# Patient Record
Sex: Male | Born: 1960 | Race: White | Hispanic: No | Marital: Married | Smoking: Never smoker
Health system: Southern US, Community
[De-identification: ages and names within clinical notes are randomized; demographics above are authoritative.]

## PROBLEM LIST (undated history)

## (undated) DIAGNOSIS — E669 Obesity, unspecified: Secondary | ICD-10-CM

## (undated) DIAGNOSIS — K831 Obstruction of bile duct: Secondary | ICD-10-CM

## (undated) DIAGNOSIS — L03116 Cellulitis of left lower limb: Secondary | ICD-10-CM

## (undated) DIAGNOSIS — E119 Type 2 diabetes mellitus without complications: Secondary | ICD-10-CM

## (undated) DIAGNOSIS — A414 Sepsis due to anaerobes: Secondary | ICD-10-CM

## (undated) DIAGNOSIS — T8649 Other complications of liver transplant: Secondary | ICD-10-CM

## (undated) HISTORY — PX: EXPLORATORY LAPAROTOMY: SUR591

## (undated) HISTORY — PX: TONSILLECTOMY: SUR1361

## (undated) HISTORY — PX: ERCP W/ METAL STENT PLACEMENT: SHX1521

## (undated) HISTORY — PX: HERNIA REPAIR: SHX51

---

## 2008-06-15 ENCOUNTER — Ambulatory Visit: Payer: Self-pay | Admitting: Internal Medicine

## 2009-04-11 ENCOUNTER — Ambulatory Visit: Payer: Self-pay | Admitting: Gastroenterology

## 2009-05-27 ENCOUNTER — Ambulatory Visit: Payer: Self-pay | Admitting: Internal Medicine

## 2009-06-27 ENCOUNTER — Ambulatory Visit: Payer: Self-pay | Admitting: Internal Medicine

## 2009-07-02 ENCOUNTER — Ambulatory Visit: Payer: Self-pay | Admitting: Gastroenterology

## 2009-07-06 ENCOUNTER — Ambulatory Visit: Payer: Self-pay | Admitting: Internal Medicine

## 2009-07-13 ENCOUNTER — Inpatient Hospital Stay: Payer: Self-pay | Admitting: Internal Medicine

## 2009-07-27 ENCOUNTER — Ambulatory Visit: Payer: Self-pay | Admitting: Internal Medicine

## 2009-08-09 ENCOUNTER — Inpatient Hospital Stay: Payer: Self-pay | Admitting: Internal Medicine

## 2009-08-20 ENCOUNTER — Ambulatory Visit: Payer: Self-pay | Admitting: Internal Medicine

## 2009-08-21 ENCOUNTER — Ambulatory Visit: Payer: Self-pay | Admitting: Gastroenterology

## 2009-08-23 ENCOUNTER — Emergency Department: Payer: Self-pay | Admitting: Emergency Medicine

## 2009-08-27 ENCOUNTER — Ambulatory Visit: Payer: Self-pay | Admitting: Internal Medicine

## 2009-10-04 ENCOUNTER — Ambulatory Visit: Payer: Self-pay | Admitting: Gastroenterology

## 2009-12-13 ENCOUNTER — Ambulatory Visit: Payer: Self-pay | Admitting: Gastroenterology

## 2009-12-31 DIAGNOSIS — Z944 Liver transplant status: Secondary | ICD-10-CM | POA: Insufficient documentation

## 2010-07-15 DIAGNOSIS — B259 Cytomegaloviral disease, unspecified: Secondary | ICD-10-CM | POA: Insufficient documentation

## 2010-10-24 DIAGNOSIS — K831 Obstruction of bile duct: Secondary | ICD-10-CM | POA: Insufficient documentation

## 2010-11-18 DIAGNOSIS — D72819 Decreased white blood cell count, unspecified: Secondary | ICD-10-CM | POA: Insufficient documentation

## 2010-11-18 DIAGNOSIS — E119 Type 2 diabetes mellitus without complications: Secondary | ICD-10-CM | POA: Insufficient documentation

## 2011-03-12 HISTORY — PX: ROUX-EN-Y PROCEDURE: SUR1287

## 2012-01-05 DIAGNOSIS — D849 Immunodeficiency, unspecified: Secondary | ICD-10-CM | POA: Insufficient documentation

## 2012-03-13 IMAGING — XA DG CHEST 1V
1 series · 1 of 1 positions shown · non-contrast
Comparison: none

REASON FOR EXAM: PICC placement
COMMENTS:

PROCEDURE:     VAS - CHEST FRONTAL SINGLE VIEW  - July 17, 2009  [DATE]
RESULT:     Frontal view of the right hemithorax status post PICC line
placement is performed.
A right sided PICC line is appreciated with the tip projecting in the region
of the superior vena cava/right atrial junction.

[Series 1: single · 1 of 1 slices shown]
[im 1/1]
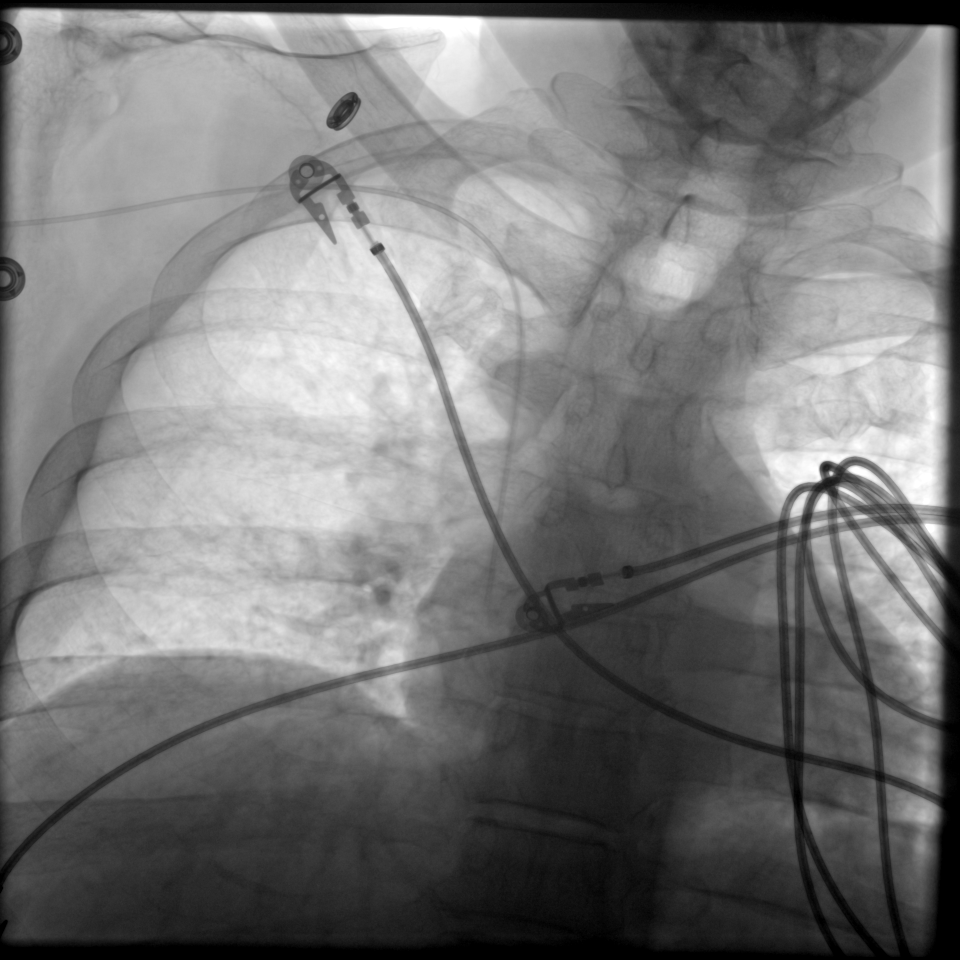

[1 of 1 positions shown; findings below may reference images not displayed]

IMPRESSION: Right sided PICC line placement as described above.

## 2012-04-19 IMAGING — CT CT ABD-PELV W/ CM
1 of 3 series · 14 of 32 positions shown, 19 images · IV contrast (isovue)
Comparison: 04/11/2009

REASON FOR EXAM: (1) RUQ/RLQ/right flank pain sp liver biopsy &
paracentesis; (2) RUQ/RLQ/right f
COMMENTS:

PROCEDURE:     CT  - CT ABDOMEN / PELVIS  W  - August 23, 2009  [DATE]
RESULT:     History: Right lower quadrant pain
TECHNIQUE: Multiple axial images of the abdomen and pelvis were performed
from the lung bases to the pubic symphysis, with p.o. contrast and with 100
ml of Isovue 370 intravenous contrast.

[Series 2: 3mm soft tissue · axial · 0.83mm/px · z∈[-658,-208]mm · 14 of 168 slices shown, 19 images]
[im 9/168  soft-tissue]
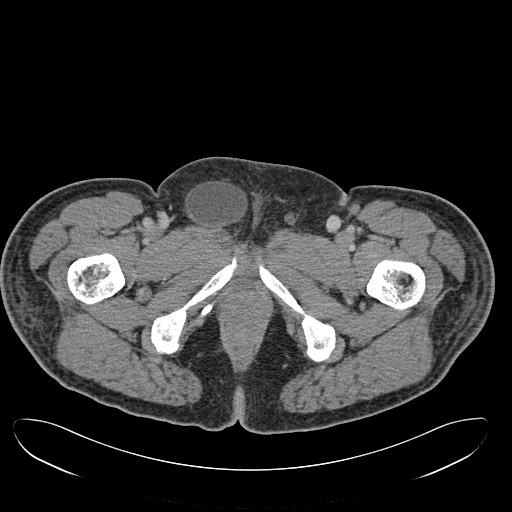
[im 9/168  bone]
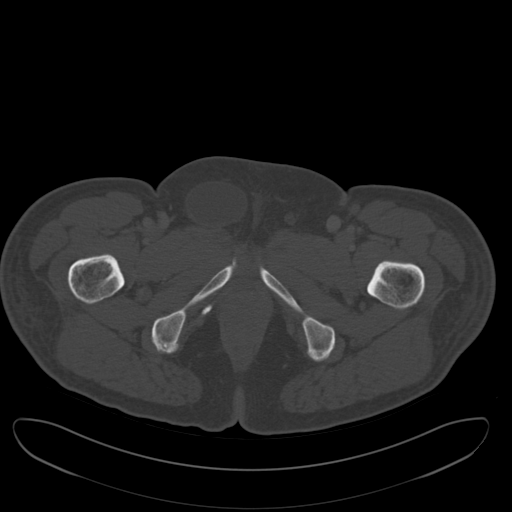
[im 26/168  soft-tissue]
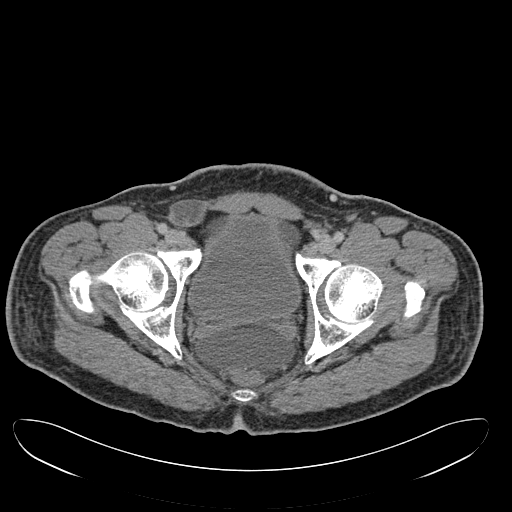
[im 34/168  soft-tissue]
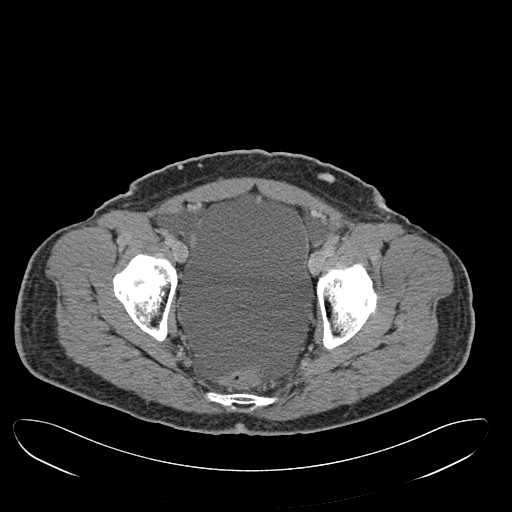
[im 51/168  soft-tissue]
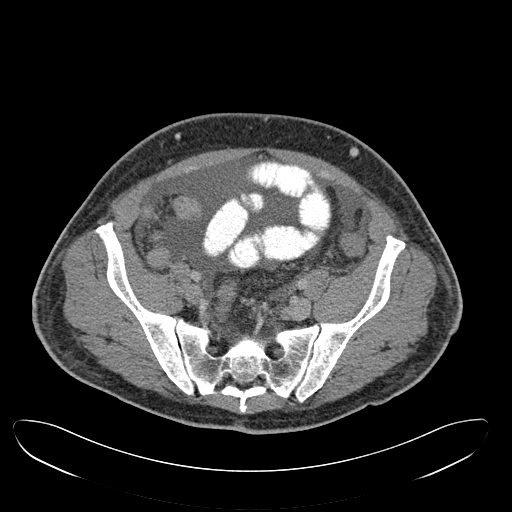
[im 59/168  soft-tissue]
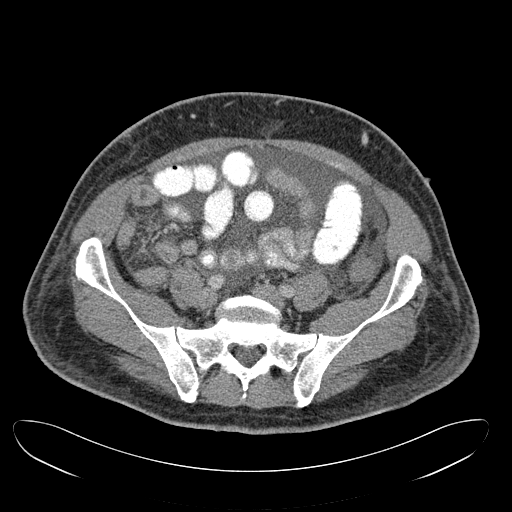
[im 76/168  soft-tissue]
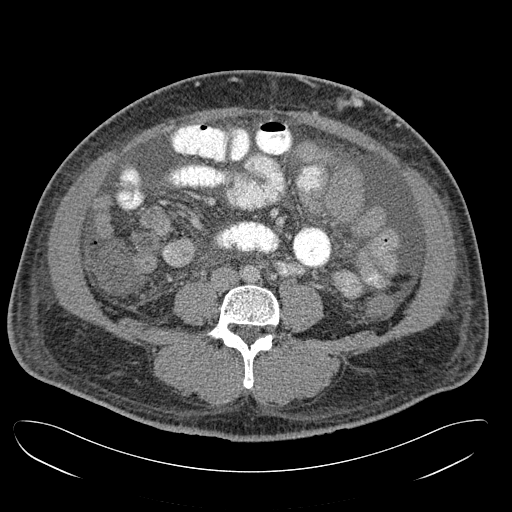
[im 84/168  soft-tissue]
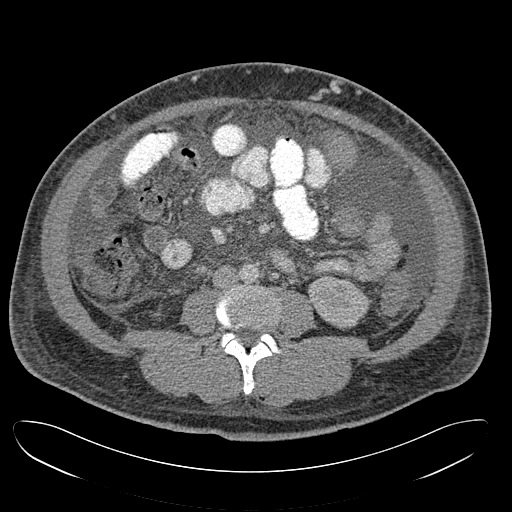
[im 92/168  soft-tissue]
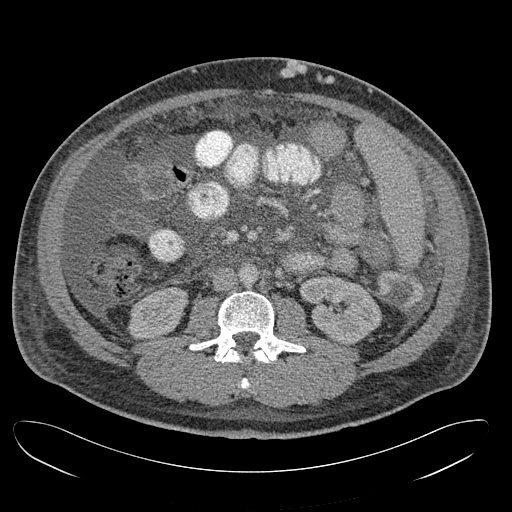
[im 109/168  soft-tissue]
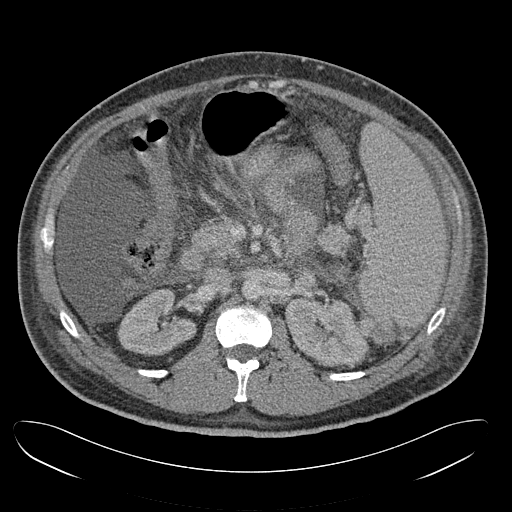
[im 109/168  bone]
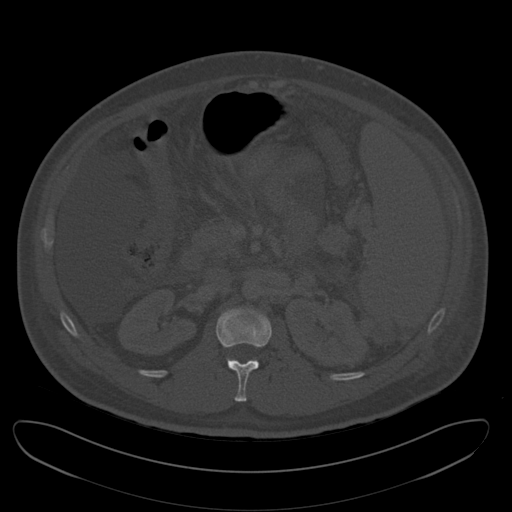
[im 117/168  soft-tissue]
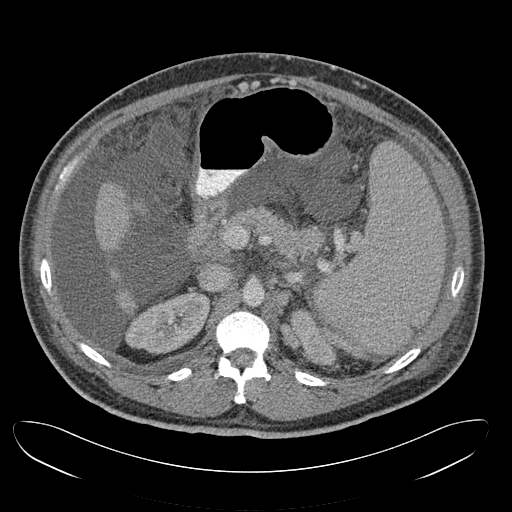
[im 134/168  soft-tissue]
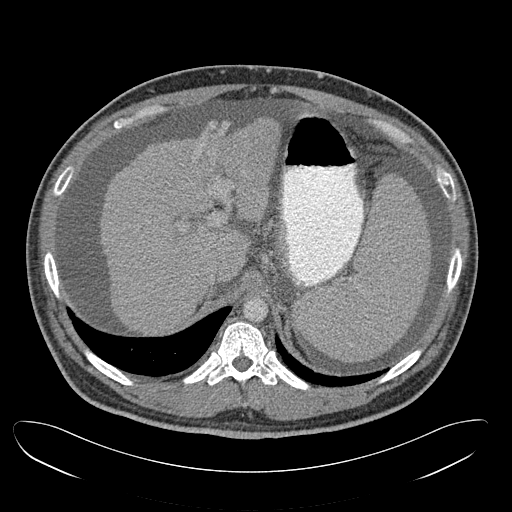
[im 134/168  lung]
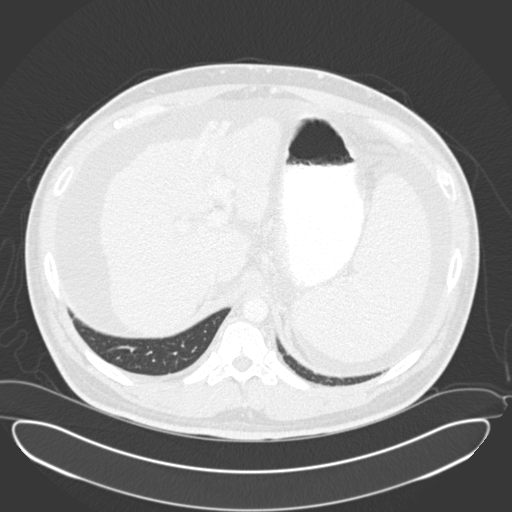
[im 142/168  soft-tissue]
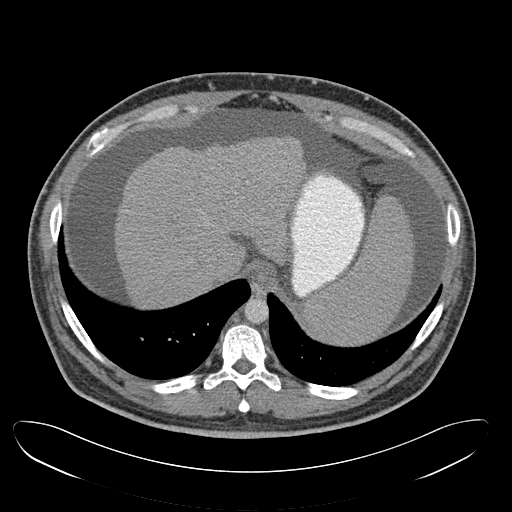
[im 142/168  lung]
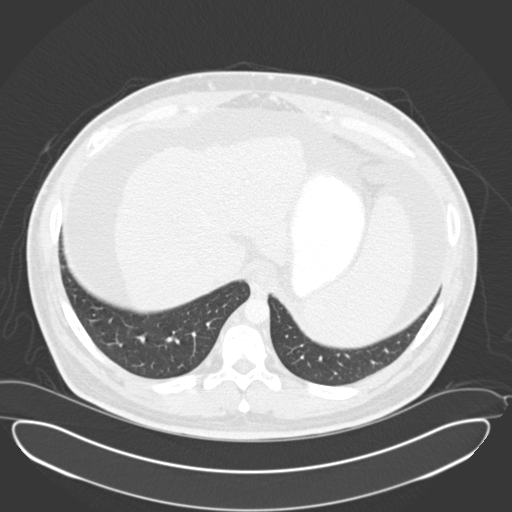
[im 151/168  lung]
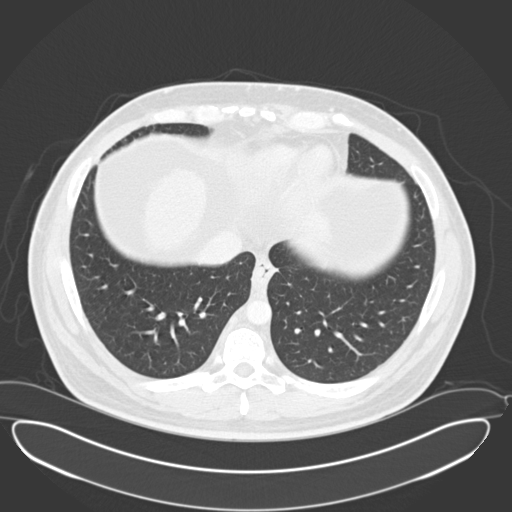
[im 159/168  soft-tissue]
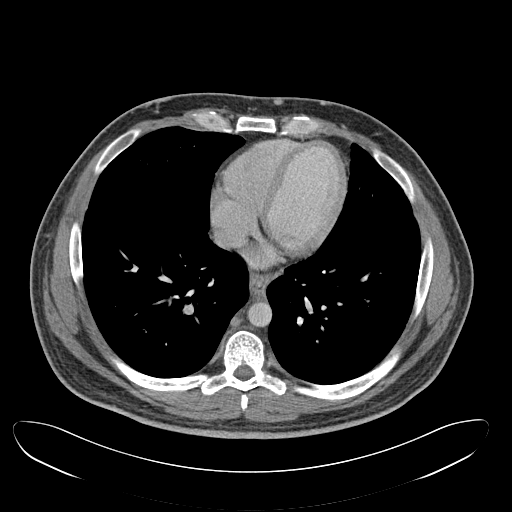
[im 159/168  lung]
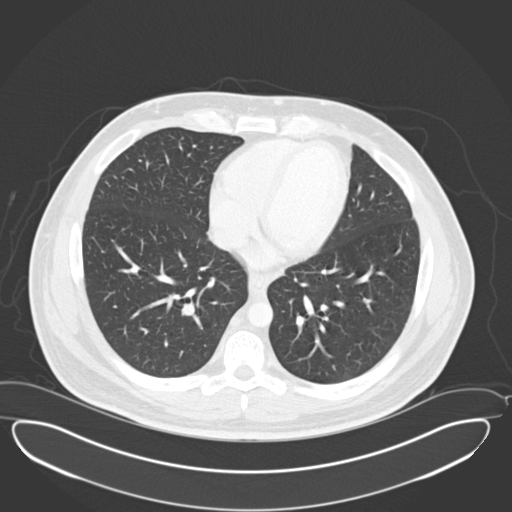

[14 of 32 positions shown; findings below may reference images not displayed]

FINDINGS: The lung bases are clear. There is no pneumothorax. The heart size is
normal.

The liver is diminutive in size with a micronodular contour consistent with
cirrhosis. There are gastric varices.  The spleen is enlarged measuring
cm in greatest length. There is no intrahepatic or extrahepatic biliary
ductal dilatation. The gallbladder is unremarkable. The spleen demonstrates
no focal abnormality. The kidneys, adrenal glands, and pancreas are normal.
The bladder is unremarkable.

There is anasarca. There is a large amount of ascites. The stomach,
duodenum, small intestine, and large intestine demonstrate no contrast
extravasation or dilatation. There is a normal caliber appendix in the right
lower quadrant without periappendiceal inflammatory changes. There is a
right inguinal hernia containing ascites. There is no pneumoperitoneum,
pneumatosis, or portal venous gas. There is no lymphadenopathy.

The abdominal aorta is normal in caliber.

The osseous structures are unremarkable.
IMPRESSION: 1. Cirrhotic liver with extensive varices consistent with portal
hypertension and a large amount of ascites.
2. Normal appendix.

## 2013-07-11 DIAGNOSIS — K7581 Nonalcoholic steatohepatitis (NASH): Secondary | ICD-10-CM | POA: Insufficient documentation

## 2016-10-08 ENCOUNTER — Encounter: Payer: Self-pay | Admitting: *Deleted

## 2016-10-09 ENCOUNTER — Ambulatory Visit: Payer: 59 | Admitting: Anesthesiology

## 2016-10-09 ENCOUNTER — Ambulatory Visit
Admission: RE | Admit: 2016-10-09 | Discharge: 2016-10-09 | Disposition: A | Discharge status: Home / Self Care | Payer: 59 | Source: Ambulatory Visit | Attending: Gastroenterology | Admitting: Gastroenterology

## 2016-10-09 ENCOUNTER — Encounter
Admission: RE | Disposition: A | Discharge status: Home / Self Care | Payer: Self-pay | Source: Ambulatory Visit | Attending: Gastroenterology

## 2016-10-09 DIAGNOSIS — Z944 Liver transplant status: Secondary | ICD-10-CM | POA: Diagnosis not present

## 2016-10-09 DIAGNOSIS — E8801 Alpha-1-antitrypsin deficiency: Secondary | ICD-10-CM | POA: Insufficient documentation

## 2016-10-09 DIAGNOSIS — K5730 Diverticulosis of large intestine without perforation or abscess without bleeding: Secondary | ICD-10-CM | POA: Diagnosis not present

## 2016-10-09 DIAGNOSIS — Z79899 Other long term (current) drug therapy: Secondary | ICD-10-CM | POA: Insufficient documentation

## 2016-10-09 DIAGNOSIS — Z91040 Latex allergy status: Secondary | ICD-10-CM | POA: Insufficient documentation

## 2016-10-09 DIAGNOSIS — Z7984 Long term (current) use of oral hypoglycemic drugs: Secondary | ICD-10-CM | POA: Diagnosis not present

## 2016-10-09 DIAGNOSIS — Z1211 Encounter for screening for malignant neoplasm of colon: Secondary | ICD-10-CM | POA: Diagnosis not present

## 2016-10-09 DIAGNOSIS — K7469 Other cirrhosis of liver: Secondary | ICD-10-CM | POA: Diagnosis not present

## 2016-10-09 DIAGNOSIS — D124 Benign neoplasm of descending colon: Secondary | ICD-10-CM | POA: Diagnosis not present

## 2016-10-09 DIAGNOSIS — E119 Type 2 diabetes mellitus without complications: Secondary | ICD-10-CM | POA: Diagnosis not present

## 2016-10-09 HISTORY — DX: Obstruction of bile duct: K83.1

## 2016-10-09 HISTORY — DX: Sepsis due to anaerobes: A41.4

## 2016-10-09 HISTORY — DX: Cellulitis of left lower limb: L03.116

## 2016-10-09 HISTORY — DX: Obesity, unspecified: E66.9

## 2016-10-09 HISTORY — DX: Type 2 diabetes mellitus without complications: E11.9

## 2016-10-09 HISTORY — DX: Other complications of liver transplant: T86.49

## 2016-10-09 HISTORY — PX: COLONOSCOPY WITH PROPOFOL: SHX5780

## 2016-10-09 LAB — CBC WITH DIFFERENTIAL/PLATELET
BASOS ABS: 0 10*3/uL (ref 0–0.1)
Basophils Relative: 0 %
Eosinophils Absolute: 0.2 10*3/uL (ref 0–0.7)
Eosinophils Relative: 4 %
HCT: 46.9 % (ref 40.0–52.0)
HEMOGLOBIN: 17 g/dL (ref 13.0–18.0)
LYMPHS ABS: 1.2 10*3/uL (ref 1.0–3.6)
Lymphocytes Relative: 23 %
MCH: 33.2 pg (ref 26.0–34.0)
MCHC: 36.2 g/dL — ABNORMAL HIGH (ref 32.0–36.0)
MCV: 91.6 fL (ref 80.0–100.0)
Monocytes Absolute: 0.4 10*3/uL (ref 0.2–1.0)
Monocytes Relative: 8 %
NEUTROS PCT: 65 %
Neutro Abs: 3.4 10*3/uL (ref 1.4–6.5)
PLATELETS: 128 10*3/uL — AB (ref 150–440)
RBC: 5.12 MIL/uL (ref 4.40–5.90)
RDW: 14.3 % (ref 11.5–14.5)
WBC: 5.2 10*3/uL (ref 3.8–10.6)

## 2016-10-09 LAB — PROTIME-INR
INR: 0.98
Prothrombin Time: 12.9 seconds (ref 11.4–15.2)

## 2016-10-09 LAB — GLUCOSE, CAPILLARY: GLUCOSE-CAPILLARY: 198 mg/dL — AB (ref 65–99)

## 2016-10-09 SURGERY — COLONOSCOPY WITH PROPOFOL
Anesthesia: General

## 2016-10-09 MED ORDER — PROPOFOL 10 MG/ML IV BOLUS
INTRAVENOUS | Status: DC | PRN
  Administered 2016-10-09: 100 mg via INTRAVENOUS

## 2016-10-09 MED ORDER — ONDANSETRON HCL 4 MG/2ML IJ SOLN: 4.0000 mg | Freq: Once | INTRAMUSCULAR | Status: DC | PRN

## 2016-10-09 MED ORDER — FENTANYL CITRATE (PF) 100 MCG/2ML IJ SOLN: 25.0000 ug | INTRAMUSCULAR | Status: DC | PRN

## 2016-10-09 MED ORDER — SODIUM CHLORIDE 0.9 % IV SOLN
INTRAVENOUS | Status: DC | PRN
  Administered 2016-10-09: 08:00:00 via INTRAVENOUS

## 2016-10-09 MED ORDER — PROPOFOL 500 MG/50ML IV EMUL
INTRAVENOUS | Status: AC
  Filled 2016-10-09: qty 50

## 2016-10-09 MED ORDER — LIDOCAINE 2% (20 MG/ML) 5 ML SYRINGE
INTRAMUSCULAR | Status: DC | PRN
  Administered 2016-10-09: 40 mg via INTRAVENOUS

## 2016-10-09 MED ORDER — LIDOCAINE HCL (PF) 2 % IJ SOLN
INTRAMUSCULAR | Status: AC
  Filled 2016-10-09: qty 2

## 2016-10-09 MED ORDER — FENTANYL CITRATE (PF) 100 MCG/2ML IJ SOLN
INTRAMUSCULAR | Status: DC | PRN
  Administered 2016-10-09: 50 ug via INTRAVENOUS

## 2016-10-09 MED ORDER — MIDAZOLAM HCL 2 MG/2ML IJ SOLN
INTRAMUSCULAR | Status: AC
  Filled 2016-10-09: qty 2

## 2016-10-09 MED ORDER — FENTANYL CITRATE (PF) 100 MCG/2ML IJ SOLN
INTRAMUSCULAR | Status: AC
Start: 2016-10-09 — End: 2016-10-09
  Filled 2016-10-09: qty 2

## 2016-10-09 MED ORDER — SODIUM CHLORIDE 0.9 % IV SOLN: INTRAVENOUS | Status: DC

## 2016-10-09 MED ORDER — PROPOFOL 10 MG/ML IV BOLUS
INTRAVENOUS | Status: AC
  Filled 2016-10-09: qty 20

## 2016-10-09 MED ORDER — PROPOFOL 500 MG/50ML IV EMUL
INTRAVENOUS | Status: DC | PRN
  Administered 2016-10-09: 160 ug/kg/min via INTRAVENOUS

## 2016-10-09 MED ORDER — MIDAZOLAM HCL 5 MG/5ML IJ SOLN
INTRAMUSCULAR | Status: DC | PRN
  Administered 2016-10-09: 1 mg via INTRAVENOUS

## 2016-10-09 MED ORDER — SODIUM CHLORIDE 0.9 % IV SOLN
INTRAVENOUS | Status: DC
  Administered 2016-10-09: 09:00:00 via INTRAVENOUS

## 2016-10-09 NOTE — Anesthesia Postprocedure Evaluation (Signed)
Anesthesia Post Note  Patient: Matthew Benson  Procedure(s) Performed: Procedure(s) (LRB): COLONOSCOPY WITH PROPOFOL (N/A)  Patient location during evaluation: PACU Anesthesia Type: General Level of consciousness: awake and alert and oriented Pain management: pain level controlled Vital Signs Assessment: post-procedure vital signs reviewed and stable Respiratory status: spontaneous breathing Cardiovascular status: blood pressure returned to baseline Anesthetic complications: no     Last Vitals:  Vitals:   10/09/16 0924 10/09/16 0953  BP:  (!) 124/91  Pulse:  88  Resp:  16  Temp: (!) 36.2 C   SpO2:  97%    Last Pain:  Vitals:   10/09/16 0818  TempSrc: Tympanic                 Evelio Rueda

## 2016-10-09 NOTE — Transfer of Care (Signed)
Immediate Anesthesia Transfer of Care Note  Patient: Matthew Benson  Procedure(s) Performed: Procedure(s): COLONOSCOPY WITH PROPOFOL (N/A)  Patient Location: PACU and Endoscopy Unit  Anesthesia Type:General  Level of Consciousness: drowsy  Airway & Oxygen Therapy: Patient Spontanous Breathing and Patient connected to nasal cannula oxygen  Post-op Assessment: Report given to RN and Post -op Vital signs reviewed and stable  Post vital signs: Reviewed and stable  Last Vitals:  Vitals:   10/09/16 0818  BP: 130/90  Pulse: 94  Resp: 16  Temp: (!) 36.3 C  SpO2: 99%    Last Pain:  Vitals:   10/09/16 0818  TempSrc: Tympanic         Complications: No apparent anesthesia complications

## 2016-10-09 NOTE — Anesthesia Preprocedure Evaluation (Addendum)
Anesthesia Evaluation  Patient identified by MRN, date of birth, ID band Patient awake    Reviewed: Allergy & Precautions, NPO status , Patient's Chart, lab work & pertinent test results  Airway Mallampati: II  TM Distance: >3 FB     Dental  (+) Chipped   Pulmonary pneumonia, resolved,    Pulmonary exam normal        Cardiovascular negative cardio ROS Normal cardiovascular exam     Neuro/Psych    GI/Hepatic   Endo/Other  diabetes  Renal/GU negative Renal ROS  negative genitourinary   Musculoskeletal negative musculoskeletal ROS (+)   Abdominal Normal abdominal exam  (+)   Peds negative pediatric ROS (+)  Hematology negative hematology ROS (+)   Anesthesia Other Findings   Reproductive/Obstetrics                            Anesthesia Physical Anesthesia Plan  ASA: III  Anesthesia Plan: General   Post-op Pain Management:    Induction: Intravenous  PONV Risk Score and Plan:   Airway Management Planned: Nasal Cannula  Additional Equipment:   Intra-op Plan:   Post-operative Plan:   Informed Consent: I have reviewed the patients History and Physical, chart, labs and discussed the procedure including the risks, benefits and alternatives for the proposed anesthesia with the patient or authorized representative who has indicated his/her understanding and acceptance.   Dental advisory given  Plan Discussed with: CRNA and Surgeon  Anesthesia Plan Comments:         Anesthesia Quick Evaluation

## 2016-10-09 NOTE — H&P (Signed)
Outpatient short stay form Pre-procedure 10/09/2016 8:42 AM Lollie Sails MD  Primary Physician: Dr. Enis Gash  Reason for visit:  Screening colonoscopy  History of present illness:  Patient is being seen in referral from [facility] hepatology in regards to colon cancer screening. He tolerated his prep well. He takes no aspirin or blood thinning agents with the exception of 81 mg daily aspirin that was held with yesterday.  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 He has done well since his transplant. CBC and pro time were checked today and were good for procedure.    Current Facility-Administered Medications:  .  0.9 %  sodium chloride infusion, , Intravenous, Continuous, Lollie Sails, MD, Last Rate: 20 mL/hr at 10/09/16 [phone] .  0.9 %  sodium chloride infusion, , Intravenous, Continuous, Lollie Sails, MD  Facility-Administered Medications Ordered in Other Encounters:  .  0.9 %  sodium chloride infusion, , , Continuous PRN, Courtney Paris, CRNA  Prescriptions Prior to Admission  Medication Sig Dispense Refill Last Dose  . glimepiride (AMARYL) 1 MG tablet Take 1 mg by mouth daily with breakfast.   10/08/2016 at Unknown time  . Liraglutide (VICTOZA [place]) Inject into the skin.   10/08/2016 at Unknown time  . liraglutide (VICTOZA) 18 MG/3ML SOPN Inject 18 mg into the skin daily.   10/08/2016 at Unknown time  . metFORMIN (GLUCOPHAGE) 500 MG tablet Take by mouth 2 (two) times daily with a meal.   10/08/2016 at Unknown time  . omeprazole (PRILOSEC) 20 MG capsule Take 20 mg by mouth daily.   10/08/2016 at Unknown time  . oxyCODONE (OXY IR/ROXICODONE) 5 MG immediate release tablet Take 5 mg by mouth every 6 (six) hours as needed for severe pain.     Marland Kitchen tacrolimus (PROGRAF) 1 MG capsule Take 1 mg by mouth 2 (two) times daily. Take 2 mg in am and 1 mg in pm   10/08/2016 at Unknown time  . vitamin E 200 UNIT capsule Take  200 Units by mouth daily.     Marland Kitchen CALCIUM CARBONATE PO Take 500 mg by mouth daily.   Not Taking at Unknown time     Allergies  Allergen Reactions  . Latex      Past Medical History:  Diagnosis Date  . 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 Cellulitis of left leg   . Diabetes mellitus without complication ([place])   . Klebsiella pneumoniae sepsis ([place])   . Obesity     Review of systems:      Physical Exam    Heart and lungs: Regular rate and rhythm without rub or gallop, lungs are bilaterally clear.    HEENT: Normocephalic atraumatic eyes are anicteric    Other:     Pertinant exam for procedure: Soft nontender nondistended bowel sounds positive normoactive    Planned proceedures: Colonoscopy and indicated procedures. I have discussed the risks benefits and complications of procedures to include not limited to bleeding, infection, perforation and the risk of sedation and the patient wishes to proceed.    Lollie Sails, MD Gastroenterology 10/09/2016  8:42 AM

## 2016-10-09 NOTE — Op Note (Signed)
[facility] Gastroenterology Patient Name: Matthew Benson Procedure Date: 10/09/2016 8:43 AM MRN: [id number] Account #: [id number] Date of Birth: Dec 19, 1960 Admit Type: Outpatient Age: 56 Room: [facility] ENDO ROOM 1 Gender: Male Note Status: Finalized Procedure:            Colonoscopy Indications:          Screening for colorectal malignant neoplasm Providers:            Lollie Sails, MD Referring MD:         Leona Carry. Hall Busing, MD (Referring MD) Medicines:            Monitored Anesthesia Care Complications:        No immediate complications. Procedure:            Pre-Anesthesia Assessment:                       - ASA Grade Assessment: III - A patient with severe                        systemic disease.                       After obtaining informed consent, the colonoscope was                        passed under direct vision. Throughout the procedure,                        the patient's blood pressure, pulse, and oxygen                        saturations were monitored continuously. The                        Colonoscope was introduced through the anus and                        advanced to the the cecum, identified by appendiceal                        orifice and ileocecal valve. The patient tolerated the                        procedure well. The quality of the bowel preparation                        was good except the ascending colon was fair. Findings:      A 2 mm polyp was found in the proximal descending colon. The polyp was       sessile. The polyp was removed with a cold biopsy forceps. Resection and       retrieval were complete.      A localized area of mildly granular mucosa was found at the ileocecal       valve. Biopsies were taken with a cold forceps for histology.      Multiple small-mouthed diverticula were found in the sigmoid colon and       descending colon.      prominant rectal vasculature in the distal rectum, no varices or       internal  hemorrhoids      The digital  rectal exam was normal.      No additional abnormalities were found on retroflexion. Impression:           - One 2 mm polyp in the proximal descending colon,                        removed with a cold biopsy forceps. Resected and                        retrieved.                       - Granular mucosa at the ileocecal valve. Biopsied.                       - Diverticulosis in the sigmoid colon and in the                        descending colon. Recommendation:       - Discharge patient to home.                       - Soft diet today, then advance as tolerated to advance                        diet as tolerated. Procedure Code(s):    --- Professional ---                       [phone], Colonoscopy, flexible; with biopsy, single or                        multiple Diagnosis Code(s):    --- Professional ---                       Z12.11, Encounter for screening for malignant neoplasm                        of colon                       D12.4, Benign neoplasm of descending colon                       K63.89, Other specified diseases of intestine                       K57.30, Diverticulosis of large intestine without                        perforation or abscess without bleeding CPT copyright 2016 American Medical Association. All rights reserved. The codes documented in this report are preliminary and upon coder review may  be revised to meet current compliance requirements. Lollie Sails, MD 10/09/2016 9:21:17 AM This report has been signed electronically. Number of Addenda: 0 Note Initiated On: 10/09/2016 8:43 AM Scope Withdrawal Time: 0 hours 12 minutes 9 seconds  Total Procedure Duration: 0 hours 24 minutes 1 second       [facility]

## 2016-10-09 NOTE — Anesthesia Post-op Follow-up Note (Signed)
Anesthesia QCDR form completed.        

## 2016-10-10 ENCOUNTER — Encounter: Payer: Self-pay | Admitting: Gastroenterology

## 2016-10-10 LAB — SURGICAL PATHOLOGY

## 2018-09-05 ENCOUNTER — Other Ambulatory Visit: Payer: Self-pay

## 2018-09-05 ENCOUNTER — Encounter: Payer: Self-pay | Admitting: Emergency Medicine

## 2018-09-05 ENCOUNTER — Ambulatory Visit
Admission: EM | Admit: 2018-09-05 | Discharge: 2018-09-05 | Disposition: A | Discharge status: Home / Self Care | Payer: 59 | Attending: Family Medicine | Admitting: Family Medicine

## 2018-09-05 DIAGNOSIS — M545 Low back pain, unspecified: Secondary | ICD-10-CM

## 2018-09-05 DIAGNOSIS — R81 Glycosuria: Secondary | ICD-10-CM

## 2018-09-05 LAB — URINALYSIS, COMPLETE (UACMP) WITH MICROSCOPIC
Bacteria, UA: NONE SEEN
Bilirubin Urine: NEGATIVE
Glucose, UA: 500 mg/dL — AB
Ketones, ur: NEGATIVE mg/dL
Leukocytes,Ua: NEGATIVE
Nitrite: NEGATIVE
Protein, ur: NEGATIVE mg/dL
Specific Gravity, Urine: 1.015 (ref 1.005–1.030)
WBC, UA: NONE SEEN WBC/hpf (ref 0–5)
pH: 5 (ref 5.0–8.0)

## 2018-09-05 NOTE — ED Provider Notes (Signed)
[place], [place]   Name: Matthew Benson DOB: [birth year] MRN: [id number] CSN: [id number] PCP: Albina Billet, MD  Arrival date and time:  09/05/18 1019  Chief Complaint:  Back Pain   NOTE: Prior to seeing the patient today, I have reviewed the triage nursing documentation and vital signs. Clinical staff has updated patient's PMH/PSHx, current medication list, and drug allergies/intolerances to ensure comprehensive history available to assist in medical decision making.   History:   HPI: Matthew Benson is a 58 y.o. male who presents today with complaints of acute onset lower back pain that began at approximately 12:30 am. Patient advising that he was having waves of pain every 10 minutes for approximately 2 hours. Pain is mainly in the RIGHT side of his back. Patient denies any known injury.  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 He denies any associated urinary symptoms; no dysuria or gross hematuria.  Patient denies a PMH that is significant for urolithiasis. Patient denies radiation of his pain into his lower extremities. He has not appreciated any weakness or distal paraesthesias in his legs. Patient denies any acute issues with bowel or bladder incontinence. No saddle anesthesia.   Upon arrival to the clinic, patient notes that he is not experiencing pain in his back at this time. Pain fully resolved following patient taking 2 Tylenol 650 mg tablets last night. Patient presenting today for further evaluation as he is status post a liver transplant in 2011, and thus has concerns about the pain that he experienced last night.   Past Medical History:  Diagnosis Date  . 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 Cellulitis of left leg   . Diabetes mellitus without complication ([place])   . Klebsiella pneumoniae sepsis ([place])   . Obesity     Past Surgical History:  Procedure Laterality Date  . COLONOSCOPY  WITH PROPOFOL N/A 10/09/2016   Procedure: COLONOSCOPY WITH PROPOFOL;  Surgeon: Lollie Sails, MD;  Location: [facility] ENDOSCOPY;  Service: Endoscopy;  Laterality: N/A;  . ERCP W/ METAL STENT PLACEMENT  [id number]  . EXPLORATORY LAPAROTOMY    . HERNIA REPAIR    . ROUX-EN-Y PROCEDURE  03/12/2011  . TONSILLECTOMY      History reviewed. No pertinent family history.  Social History   Tobacco Use  . Smoking status: Never Smoker  . Smokeless tobacco: Never Used  Substance Use Topics  . Alcohol use: No  . Drug use: No    There are no active problems to display for this patient.   Home Medications:    Current Meds  Medication Sig  . aspirin 81 MG chewable tablet Chew by mouth.  . metFORMIN (GLUCOPHAGE) 500 MG tablet Take by mouth 2 (two) times daily with a meal.  . omeprazole (PRILOSEC) 20 MG capsule Take 20 mg by mouth daily.  Marland Kitchen OZEMPIC, 1 MG/DOSE, 2 MG/1.5ML SOPN INJECT 1 MG UNDER THE SKIN ONCE A WEEK  . tacrolimus (PROGRAF) 1 MG capsule Take 1 mg by mouth 2 (two) times daily. Take 2 mg in am and 1 mg in pm    Allergies:   Latex  Review of Systems (ROS): Review of Systems  Constitutional: Negative for chills and fever.  Respiratory: Negative for cough and shortness of breath.   Cardiovascular: Negative for chest pain and palpitations.  Gastrointestinal: Negative for abdominal pain, diarrhea, nausea and vomiting.  Genitourinary: Negative for difficulty urinating, dysuria,  flank pain, frequency, hematuria and urgency.  Musculoskeletal: Positive for back pain (resolved). Negative for neck pain and neck stiffness.  Neurological: Negative for dizziness, syncope, weakness and headaches.  All other systems reviewed and are negative.    Vital Signs: Today's Vitals   09/05/18 1028  BP: (!) 150/86  Pulse: 97  Resp: 16  Temp: 98.2 F (36.8 C)  TempSrc: Oral  SpO2: 100%  Weight: 200 lb (90.7 kg)  Height: 5\' 10"  (1.778 m)  PainSc: 0-No pain    Physical Exam: Physical  Exam  Constitutional: He is oriented to person, place, and time and well-developed, well-nourished, and in no distress.  HENT:  Head: Normocephalic and atraumatic.  Mouth/Throat: Mucous membranes are normal.  Eyes: Pupils are equal, round, and reactive to light. EOM are normal.  Neck: Normal range of motion. Neck supple. No tracheal deviation present.  Cardiovascular: Normal rate.  Pulmonary/Chest: Effort normal. No respiratory distress.  Abdominal: Soft. Normal appearance and bowel sounds are normal. He exhibits no distension. There is no abdominal tenderness. There is no CVA tenderness.  Musculoskeletal:     Thoracic back: He exhibits normal range of motion, no tenderness, no swelling, no pain and no spasm.       Back:     Comments: Marked areas is where pain for experiencing pain last night. Pain has fully resolved at this point.   Lymphadenopathy:    He has no cervical adenopathy.  Neurological: He is alert and oriented to person, place, and time. Gait normal. GCS score is 15.  Skin: Skin is warm and dry. No rash noted.  Psychiatric: Mood, memory, affect and judgment normal.  Nursing note and vitals reviewed.   Urgent Care Treatments / Results:   LABS: PLEASE NOTE: all labs that were ordered this encounter are listed, however only abnormal results are displayed. Labs Reviewed  URINALYSIS, COMPLETE (UACMP) WITH MICROSCOPIC - Abnormal; Notable for the following components:      Result Value   Glucose, UA 500 (*)    Hgb urine dipstick TRACE (*)    All other components within normal limits    EKG: -None  RADIOLOGY: No results found.  PROCEDURES: Procedures  MEDICATIONS RECEIVED THIS VISIT: Medications - No data to display  PERTINENT CLINICAL COURSE NOTES/UPDATES:   Initial Impression / Assessment and Plan / Urgent Care Course:  Pertinent labs & imaging results that were available during my care of the patient were personally reviewed by me and considered in my  medical decision making (see lab/imaging section of note for values and interpretations).  Matthew Benson is a 58 y.o. male who presents to [facility] today with complaints of Back Pain   Patient is well appearing overall in clinic today. He does not appear to be in any acute distress. Presenting symptoms (see HPI) and exam as documented above.  UA negative for infection; (+) glycosuria (known T2DM). Favor musculoskeletal pain given the fact that symptoms abated with dose of Tylenol.  Discussed etiology likely related to normal age-related aches and pains coupled with his increased physical demands at work (frequently lifts large spools of yarn).  Again, patient concerned due to liver transplant status.  Offered to perform baseline labs, however patient electing to discuss further with transplant coordinator/team to determine utility.  Reassurance provided.  Patient to continue APAP on a PRN basis.  He was instructed to return to call to the clinic should his pain recur, or if he decided that he would like to have  lab work performed.  Discussed follow up with primary care physician and/or transplant center for re-evaluation should his symptoms recur. I have reviewed the follow up and strict return precautions for any new or worsening symptoms. Patient is aware of symptoms that would be deemed urgent/emergent, and would thus require further evaluation either here or in the emergency department. At the time of discharge, he verbalized understanding and consent with the discharge plan as it was reviewed with him. All questions were fielded by provider and/or clinic staff prior to patient discharge.    Final Clinical Impressions / Urgent Care Diagnoses:   Final diagnoses:  Acute right-sided low back pain without sciatica  Glycosuria    New Prescriptions:  [place] Controlled Substance Registry consulted? Not Applicable  No orders of the defined types were placed in this encounter.   Recommended  Follow up Care:  Patient encouraged to follow up with the following provider within the specified time frame, or sooner as dictated by the severity of his symptoms. As always, he was instructed that for any urgent/emergent care needs, he should seek care either here or in the emergency department for more immediate evaluation.  Follow-up Information    Call  PCP and/or transplant center.   Why: Advise on pain and determine need for further workup if pain reoccurs.        NOTE: This note was prepared using Lobbyist along with smaller Company secretary. Despite my best ability to proofread, there is the potential that transcriptional errors may still occur from this process, and are completely unintentional.     Karen Kitchens, NP 09/05/18 1646

## 2018-09-05 NOTE — Discharge Instructions (Signed)
It was very nice seeing you today in clinic. Thank you for entrusting me with your care.   As discussed, your pain seems to be musculoskeletal in nature. Plans for treating you are as follows:  Pain resolved while in clinic. Urine negative for infection (has trace hemoglobin). Pain went way after Tylenol.    May use Tylenol as needed for pain. Avoid overdoing it, but you need to make efforts to remain active as tolerated.  Avoiding activity all together can make your pain worse. You may find that alternating between ice and moist heat application will help with your pain.  Heat/ice should be applied for 10-15 minutes at a time at least 3-4 times a day.  Make arrangements to follow up with your regular doctor and/or transplant center if pain reoccurs. If your symptoms/condition worsens, please seek follow up care either here or in the ER. Please remember, our [place] providers are "right here with you" when you need [place].   Again, it was my pleasure to take care of you today. Thank you for choosing our clinic. I hope that you start to feel better quickly.   Honor Loh, MSN, APRN, FNP-C, CEN Advanced Practice Provider [facility]

## 2018-09-05 NOTE — ED Triage Notes (Signed)
Patient c/o right sided lower back pain that started 12:30am this morning.  Patient denies N/V.  Patient denies injury or fall.

## 2019-04-14 ENCOUNTER — Ambulatory Visit: Payer: 59 | Attending: Internal Medicine

## 2019-04-14 DIAGNOSIS — Z23 Encounter for immunization: Secondary | ICD-10-CM

## 2019-04-14 NOTE — Progress Notes (Signed)
   Covid-19 Vaccination Clinic  Name:  Matthew Benson    MRN: [id number] DOB: 01/02/1961  04/14/2019  Matthew Benson was observed post Covid-19 immunization for 15 minutes without incident. He was provided with Vaccine Information Sheet and instruction to access the V-Safe system.   Matthew Benson was instructed to call 911 with any severe reactions post vaccine: Marland Kitchen Difficulty breathing  . Swelling of face and throat  . A fast heartbeat  . A bad rash all over body  . Dizziness and weakness   Immunizations Administered    Name Date Dose VIS Date Route   Pfizer COVID-19 Vaccine 04/14/2019  4:14 PM 0.3 mL 01/07/2019 Intramuscular   Manufacturer: [place]   Lot: [id number]   Drain: [id number]

## 2019-05-09 ENCOUNTER — Ambulatory Visit: Payer: 59 | Attending: Internal Medicine

## 2019-05-09 DIAGNOSIS — Z23 Encounter for immunization: Secondary | ICD-10-CM

## 2019-05-09 NOTE — Progress Notes (Signed)
   Covid-19 Vaccination Clinic  Name:  Matthew Benson    MRN: [id number] DOB: 12/13/1960  05/09/2019  [name] was observed post Covid-19 immunization for 15 minutes without incident. He was provided with Vaccine Information Sheet and instruction to access the V-Safe system.   [name] was instructed to call 911 with any severe reactions post vaccine: Marland Kitchen Difficulty breathing  . Swelling of face and throat  . A fast heartbeat  . A bad rash all over body  . Dizziness and weakness   Immunizations Administered    Name Date Dose VIS Date Route   Pfizer COVID-19 Vaccine 05/09/2019  4:15 PM 0.3 mL 01/07/2019 Intramuscular   Manufacturer: [place]   Lot: [id number]   [place]: [id number]

## 2022-06-17 ENCOUNTER — Ambulatory Visit (INDEPENDENT_AMBULATORY_CARE_PROVIDER_SITE_OTHER): Payer: Managed Care, Other (non HMO) | Admitting: Gastroenterology

## 2022-06-17 ENCOUNTER — Encounter: Payer: Self-pay | Admitting: Gastroenterology

## 2022-06-17 VITALS — BP 134/86 | HR 106 | Temp 98.4°F | Ht 70.0 in | Wt 194.1 lb

## 2022-06-17 DIAGNOSIS — Z1211 Encounter for screening for malignant neoplasm of colon: Secondary | ICD-10-CM | POA: Diagnosis not present

## 2022-06-17 MED ORDER — NA SULFATE-K SULFATE-MG SULF 17.5-3.13-1.6 GM/177ML PO SOLN: 1.0000 | Freq: Once | ORAL | 0 refills | Status: AC

## 2022-06-17 NOTE — Progress Notes (Signed)
Gastroenterology Consultation  Referring Provider:     Jaclyn Shaggy, MD Primary Care Physician:  Jaclyn Shaggy, MD Primary Gastroenterologist:  Dr. Servando Snare     Reason for Consultation:     History of colon polyps        HPI:   Matthew Benson is a 62 y.o. y/o male referred for consultation & management of history of colon polyps by Dr. Arlana Pouch, Jillene Bucks, MD. This patient comes in today after having a colonoscopy by Dr. Marva Panda back in 2018 with a tubular adenoma found at that time.  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  The patient is presently on tacrolimus.  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 the patient now comes in today prior to having a colonoscopy due to his history of being on immunosuppression and having a liver transplant and a history of a tubular adenoma.  Past Medical History:  Diagnosis Date   Biliary stricture of transplanted liver (HCC)    Cellulitis of left leg    Diabetes mellitus without complication (HCC)    Klebsiella pneumoniae sepsis (HCC)    Obesity     Past Surgical History:  Procedure Laterality Date   COLONOSCOPY WITH PROPOFOL N/A 10/09/2016   Procedure: COLONOSCOPY WITH PROPOFOL;  Surgeon: Christena Deem, MD;  Location: [facility] ENDOSCOPY;  Service: Endoscopy;  Laterality: N/A;   ERCP W/ METAL STENT PLACEMENT  09/0202012   EXPLORATORY LAPAROTOMY     HERNIA REPAIR     ROUX-EN-Y PROCEDURE  03/12/2011   TONSILLECTOMY      Prior to Admission medications   Medication Sig Start Date End Date Taking? Authorizing Provider  aspirin 81 MG chewable tablet Chew by mouth. 01/07/10   [provider]  CALCIUM CARBONATE PO Take 500 mg by mouth daily.    [provider]  metFORMIN (GLUCOPHAGE) 500 MG tablet Take by mouth 2 (two) times daily with a meal.    [provider]  omeprazole (PRILOSEC) 20 MG capsule Take 20 mg by mouth daily.     [provider]  oxyCODONE (OXY IR/ROXICODONE) 5 MG immediate release tablet Take 5 mg by mouth every 6 (six) hours as needed for severe pain.    [provider]  OZEMPIC, 1 MG/DOSE, 2 MG/1.5ML SOPN INJECT 1 MG UNDER THE SKIN ONCE A WEEK 07/30/18   [provider]  tacrolimus (PROGRAF) 1 MG capsule Take 1 mg by mouth 2 (two) times daily. Take 2 mg in am and 1 mg in pm    [provider]  glimepiride (AMARYL) 1 MG tablet Take 1 mg by mouth daily with breakfast.  09/05/18  [provider]  liraglutide (VICTOZA) 18 MG/3ML SOPN Inject 18 mg into the skin daily.  09/05/18  [provider]    No family history on file.   Social History   Tobacco Use   Smoking status: Never   Smokeless tobacco: Never  Vaping Use   Vaping Use: Never used  Substance Use Topics   Alcohol use: No   Drug use: No    Allergies as of 06/17/2022 - Review Complete 09/05/2018  Allergen Reaction Noted   Latex  10/08/2016    Review of Systems:    All systems reviewed and negative except where noted in HPI.   Physical Exam:  There were no vitals taken for this visit. No LMP  for male patient. General:   Alert,  Well-developed, well-nourished, pleasant and cooperative in NAD Head:  Normocephalic and atraumatic. Eyes:  Sclera clear, no icterus.   Conjunctiva pink. Ears:  Normal auditory acuity. Neck:  Supple; no masses or thyromegaly. Lungs:  Respirations even and unlabored.  Clear throughout to auscultation.   No wheezes, crackles, or rhonchi. No acute distress. Heart:  Regular rate and rhythm; no murmurs, clicks, rubs, or gallops. Abdomen:  Normal bowel sounds.  No bruits.  Soft, non-tender and non-distended without masses, hepatosplenomegaly or hernias noted.  No guarding or rebound tenderness.  Negative Carnett sign.   Rectal:  Deferred.  Pulses:  Normal pulses noted. Extremities:  No clubbing or edema.  No cyanosis. Neurologic:  Alert and oriented x3;  grossly  normal neurologically. Skin:  Intact without significant lesions or rashes.  No jaundice. Lymph Nodes:  No significant cervical adenopathy. Psych:  Alert and cooperative. Normal mood and affect.  Imaging Studies: No results found.  Assessment and Plan:   Matthew Benson is a 62 y.o. y/o male who comes in today with a history of a tubular adenoma and had a liver transplant.  The patient is due for repeat colonoscopy.  The patient does not need to modify any of his medications that he is taking for his liver transplant prior to having the colonoscopy.  The patient will be set up for colonoscopy at the next avail appointment.  The patient has been explained the plan and agrees with it.    Midge Minium, MD. Clementeen Graham    Note: This dictation was prepared with Dragon dictation along with smaller phrase technology. Any transcriptional errors that result from this process are unintentional.

## 2022-06-18 NOTE — Addendum Note (Signed)
Addended by: Roena Malady on: 06/18/2022 09:26 AM   Modules accepted: Orders

## 2022-08-11 ENCOUNTER — Encounter: Payer: Self-pay | Admitting: Gastroenterology

## 2022-08-12 ENCOUNTER — Ambulatory Visit: Payer: Managed Care, Other (non HMO) | Admitting: Anesthesiology

## 2022-08-12 ENCOUNTER — Encounter: Payer: Self-pay | Admitting: Gastroenterology

## 2022-08-12 ENCOUNTER — Encounter
Admission: RE | Disposition: A | Discharge status: Home / Self Care | Payer: Self-pay | Source: Ambulatory Visit | Attending: Gastroenterology

## 2022-08-12 ENCOUNTER — Ambulatory Visit
Admission: RE | Admit: 2022-08-12 | Discharge: 2022-08-12 | Disposition: A | Discharge status: Home / Self Care | Payer: Managed Care, Other (non HMO) | Source: Ambulatory Visit | Attending: Gastroenterology | Admitting: Gastroenterology

## 2022-08-12 DIAGNOSIS — Z860100 Personal history of colon polyps, unspecified: Secondary | ICD-10-CM

## 2022-08-12 DIAGNOSIS — Z6826 Body mass index (BMI) 26.0-26.9, adult: Secondary | ICD-10-CM | POA: Diagnosis not present

## 2022-08-12 DIAGNOSIS — E119 Type 2 diabetes mellitus without complications: Secondary | ICD-10-CM | POA: Insufficient documentation

## 2022-08-12 DIAGNOSIS — K640 First degree hemorrhoids: Secondary | ICD-10-CM | POA: Insufficient documentation

## 2022-08-12 DIAGNOSIS — Z7985 Long-term (current) use of injectable non-insulin antidiabetic drugs: Secondary | ICD-10-CM | POA: Diagnosis not present

## 2022-08-12 DIAGNOSIS — K635 Polyp of colon: Secondary | ICD-10-CM | POA: Insufficient documentation

## 2022-08-12 DIAGNOSIS — Z1211 Encounter for screening for malignant neoplasm of colon: Secondary | ICD-10-CM | POA: Diagnosis present

## 2022-08-12 DIAGNOSIS — Z7984 Long term (current) use of oral hypoglycemic drugs: Secondary | ICD-10-CM | POA: Diagnosis not present

## 2022-08-12 DIAGNOSIS — E669 Obesity, unspecified: Secondary | ICD-10-CM | POA: Diagnosis not present

## 2022-08-12 DIAGNOSIS — Z944 Liver transplant status: Secondary | ICD-10-CM | POA: Diagnosis not present

## 2022-08-12 HISTORY — PX: COLONOSCOPY WITH PROPOFOL: SHX5780

## 2022-08-12 HISTORY — PX: POLYPECTOMY: SHX5525

## 2022-08-12 LAB — GLUCOSE, CAPILLARY: Glucose-Capillary: 293 mg/dL — ABNORMAL HIGH (ref 70–99)

## 2022-08-12 SURGERY — COLONOSCOPY WITH PROPOFOL
Anesthesia: General

## 2022-08-12 MED ORDER — LIDOCAINE HCL (CARDIAC) PF 100 MG/5ML IV SOSY
PREFILLED_SYRINGE | INTRAVENOUS | Status: DC | PRN
  Administered 2022-08-12: 50 mg via INTRAVENOUS

## 2022-08-12 MED ORDER — INSULIN ASPART 100 UNIT/ML IJ SOLN
3.0000 [IU] | Freq: Once | INTRAMUSCULAR | Status: AC
  Administered 2022-08-12: 3 [IU] via SUBCUTANEOUS

## 2022-08-12 MED ORDER — INSULIN ASPART 100 UNIT/ML IJ SOLN
INTRAMUSCULAR | Status: AC
  Filled 2022-08-12: qty 1

## 2022-08-12 MED ORDER — PROPOFOL 500 MG/50ML IV EMUL
INTRAVENOUS | Status: DC | PRN
  Administered 2022-08-12: 150 ug/kg/min via INTRAVENOUS

## 2022-08-12 MED ORDER — PROPOFOL 10 MG/ML IV BOLUS
INTRAVENOUS | Status: DC | PRN
Start: 2022-08-12 — End: 2022-08-12
  Administered 2022-08-12: 10 mg via INTRAVENOUS
  Administered 2022-08-12: 20 mg via INTRAVENOUS
  Administered 2022-08-12: 70 mg via INTRAVENOUS

## 2022-08-12 MED ORDER — SODIUM CHLORIDE 0.9 % IV SOLN
INTRAVENOUS | Status: DC
  Administered 2022-08-12: 1000 mL via INTRAVENOUS

## 2022-08-12 NOTE — H&P (Signed)
Matthew Minium, MD [facility] [address], [address] [place], [place] [address] Phone:[phone] Fax : [phone]  Primary Care Physician:  Jaclyn Shaggy, MD Primary Gastroenterologist:  Dr. Servando Snare  Pre-Procedure History & Physical: HPI:  Matthew Benson is a 62 y.o. male is here for an colonoscopy.   Past Medical History:  Diagnosis Date   Biliary stricture of transplanted liver (HCC)    Cellulitis of left leg    Diabetes mellitus without complication (HCC)    Klebsiella pneumoniae sepsis (HCC)    Obesity     Past Surgical History:  Procedure Laterality Date   COLONOSCOPY WITH PROPOFOL N/A 10/09/2016   Procedure: COLONOSCOPY WITH PROPOFOL;  Surgeon: Christena Deem, MD;  Location: [facility] ENDOSCOPY;  Service: Endoscopy;  Laterality: N/A;   ERCP W/ METAL STENT PLACEMENT  09/0202012   EXPLORATORY LAPAROTOMY     HERNIA REPAIR     ROUX-EN-Y PROCEDURE  03/12/2011   TONSILLECTOMY      Prior to Admission medications   Medication Sig Start Date End Date Taking? Authorizing Provider  aspirin 81 MG chewable tablet Chew by mouth. 01/07/10  Yes [provider]  CALCIUM CARBONATE PO Take 500 mg by mouth daily.   Yes [provider]  escitalopram (LEXAPRO) 10 MG tablet Take 10 mg by mouth daily.   Yes [provider]  metFORMIN (GLUCOPHAGE) 500 MG tablet Take by mouth 2 (two) times daily with a meal.   Yes [provider]  omeprazole (PRILOSEC) 20 MG capsule Take 20 mg by mouth daily.   Yes [provider]  tacrolimus (PROGRAF) 1 MG capsule Take 1 mg by mouth 2 (two) times daily. Take 2 mg in am and 1 mg in pm   Yes [provider]  oxyCODONE (OXY IR/ROXICODONE) 5 MG immediate release tablet Take 5 mg by mouth every 6 (six) hours as needed for severe pain. Patient not taking: Reported on 08/12/2022    [provider]  OZEMPIC, 1 MG/DOSE, 2 MG/1.5ML SOPN INJECT 1 MG UNDER THE SKIN ONCE A WEEK 07/30/18   [provider]   glimepiride (AMARYL) 1 MG tablet Take 1 mg by mouth daily with breakfast.  09/05/18  [provider]  liraglutide (VICTOZA) 18 MG/3ML SOPN Inject 18 mg into the skin daily.  09/05/18  [provider]    Allergies as of 06/17/2022 - Review Complete 06/17/2022  Allergen Reaction Noted   Latex  10/08/2016    History reviewed. No pertinent family history.  Social History   Socioeconomic History   Marital status: Married    Spouse name: Not on file   Number of children: Not on file   Years of education: Not on file   Highest education level: Not on file  Occupational History   Not on file  Tobacco Use   Smoking status: Never   Smokeless tobacco: Never  Vaping Use   Vaping status: Never Used  Substance and Sexual Activity   Alcohol use: No   Drug use: No   Sexual activity: Not on file  Other Topics Concern   Not on file  Social History Narrative   Not on file   Social Determinants of Health   Financial Resource Strain: Not on file  Food Insecurity: Not on file  Transportation Needs: Not on file  Physical Activity: Not on file  Stress: Not on file  Social Connections: Not on file  Intimate Partner Violence: Not on file    Review of Systems: See  HPI, otherwise negative ROS  Physical Exam: BP (!) 143/92   Pulse (!) 104   Temp (!) 96.5 F (35.8 C) (Temporal)   Resp 17   Ht 5\' 10"  (1.778 m)   Wt 85.3 kg   SpO2 98%   BMI 26.98 kg/m  General:   Alert,  pleasant and cooperative in NAD Head:  Normocephalic and atraumatic. Neck:  Supple; no masses or thyromegaly. Lungs:  Clear throughout to auscultation.    Heart:  Regular rate and rhythm. Abdomen:  Soft, nontender and nondistended. Normal bowel sounds, without guarding, and without rebound.   Neurologic:  Alert and  oriented x4;  grossly normal neurologically.  Impression/Plan: Matthew Benson is here for an colonoscopy to be performed for a history of adenomatous polyps on 2018   Risks,  benefits, limitations, and alternatives regarding  colonoscopy have been reviewed with the patient.  Questions have been answered.  All parties agreeable.   Matthew Minium, MD  08/12/2022, 8:52 AM

## 2022-08-12 NOTE — Anesthesia Preprocedure Evaluation (Addendum)
Anesthesia Evaluation  Patient identified by MRN, date of birth, ID band Patient awake    Reviewed: Allergy & Precautions, NPO status , Patient's Chart, lab work & pertinent test results  History of Anesthesia Complications Negative for: history of anesthetic complications  Airway Mallampati: I   Neck ROM: Full    Dental no notable dental hx.    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  Reproductive/Obstetrics                             Anesthesia Physical Anesthesia Plan  ASA: 3  Anesthesia Plan: General   Post-op Pain Management:    Induction: Intravenous  PONV Risk Score and Plan: 2 and Propofol infusion, TIVA and Treatment may vary due to age or medical condition  Airway Management Planned: Natural Airway  Additional Equipment:   Intra-op Plan:   Post-operative Plan:   Informed Consent: I have reviewed the patients History and Physical, chart, labs and discussed the procedure including the risks, benefits and alternatives for the proposed anesthesia with the patient or authorized representative who has indicated his/her understanding and acceptance.       Plan Discussed with: CRNA  Anesthesia Plan Comments: (LMA/GETA backup discussed.  Patient consented for risks of anesthesia including but not limited to:  - adverse reactions to medications - damage to eyes, teeth, lips or other oral  mucosa - nerve damage due to positioning  - sore throat or hoarseness - damage to heart, brain, nerves, lungs, other parts of body or loss of life  Informed patient about role of CRNA in peri- and intra-operative care.  Patient voiced understanding.)        Anesthesia Quick Evaluation

## 2022-08-12 NOTE — Transfer of Care (Signed)
Immediate Anesthesia Transfer of Care Note  Patient: Matthew Benson  Procedure(s) Performed: COLONOSCOPY WITH PROPOFOL  Patient Location: PACU and Endoscopy Unit  Anesthesia Type:General  Level of Consciousness: drowsy and patient cooperative  Airway & Oxygen Therapy: Patient Spontanous Breathing  Post-op Assessment: Report given to RN and Post -op Vital signs reviewed and stable  Post vital signs: Reviewed and stable  Last Vitals:  Vitals Value Taken Time  BP 100/75 08/12/22 0921  Temp    Pulse 96 08/12/22 0923  Resp 16 08/12/22 0923  SpO2 93 % 08/12/22 0923  Vitals shown include unfiled device data.  Last Pain:  Vitals:   08/12/22 0825  TempSrc: Temporal  PainSc: 0-No pain         Complications: No notable events documented.

## 2022-08-12 NOTE — Op Note (Signed)
[facility] Gastroenterology Patient Name: Matthew Benson Procedure Date: 08/12/2022 8:57 AM MRN: [id number] Account #: [id number] Date of Birth: 1960/12/08 Admit Type: Outpatient Age: 62 Room: [facility] ENDO ROOM 4 Gender: Male Note Status: Finalized Instrument Name: Prentice Docker [id number] Procedure:             Colonoscopy Indications:           High risk colon cancer surveillance: Personal history                         of colonic polyps Providers:             Midge Minium MD, MD Referring MD:          Jillene Bucks. Arlana Pouch, MD (Referring MD) Medicines:             Propofol per Anesthesia Complications:         No immediate complications. Procedure:             Pre-Anesthesia Assessment:                        - Prior to the procedure, a History and Physical was                         performed, and patient medications and allergies were                         reviewed. The patient's tolerance of previous                         anesthesia was also reviewed. The risks and benefits                         of the procedure and the sedation options and risks                         were discussed with the patient. All questions were                         answered, and informed consent was obtained. Prior                         Anticoagulants: The patient has taken no anticoagulant                         or antiplatelet agents. ASA Grade Assessment: II - A                         patient with mild systemic disease. After reviewing                         the risks and benefits, the patient was deemed in                         satisfactory condition to undergo the procedure.                        After obtaining informed consent, the colonoscope was  passed under direct vision. Throughout the procedure,                         the patient's blood pressure, pulse, and oxygen                         saturations were monitored continuously. The                          Colonoscope was introduced through the anus and                         advanced to the the cecum, identified by appendiceal                         orifice and ileocecal valve. The colonoscopy was                         performed without difficulty. The patient tolerated                         the procedure well. The quality of the bowel                         preparation was excellent. Findings:      The perianal and digital rectal examinations were normal.      A 4 mm polyp was found in the ascending colon. The polyp was sessile.       The polyp was removed with a cold snare. Resection and retrieval were       complete.      Non-bleeding internal hemorrhoids were found during retroflexion. The       hemorrhoids were Grade I (internal hemorrhoids that do not prolapse). Impression:            - One 4 mm polyp in the ascending colon, removed with                         a cold snare. Resected and retrieved.                        - Non-bleeding internal hemorrhoids. Recommendation:        - Discharge patient to home.                        - Resume previous diet.                        - Continue present medications.                        - Await pathology results.                        - Repeat colonoscopy in 7 years for surveillance. Procedure Code(s):     --- Professional ---                        [phone], Colonoscopy, flexible; with removal of  tumor(s), polyp(s), or other lesion(s) by snare                         technique Diagnosis Code(s):     --- Professional ---                        Z86.010, Personal history of colonic polyps                        D12.2, Benign neoplasm of ascending colon CPT copyright 2022 American Medical Association. All rights reserved. The codes documented in this report are preliminary and upon coder review may  be revised to meet current compliance requirements. Midge Minium MD, MD 08/12/2022 9:18:57 AM This  report has been signed electronically. Number of Addenda: 0 Note Initiated On: 08/12/2022 8:57 AM Scope Withdrawal Time: 0 hours 9 minutes 59 seconds  Total Procedure Duration: 0 hours 14 minutes 6 seconds  Estimated Blood Loss:  Estimated blood loss: none.      [facility]

## 2022-08-12 NOTE — Anesthesia Postprocedure Evaluation (Signed)
Anesthesia Post Note  Patient: Matthew Benson  Procedure(s) Performed: COLONOSCOPY WITH PROPOFOL  Patient location during evaluation: PACU Anesthesia Type: General Level of consciousness: awake and alert, oriented and patient cooperative Pain management: pain level controlled Vital Signs Assessment: post-procedure vital signs reviewed and stable Respiratory status: spontaneous breathing, nonlabored ventilation and respiratory function stable Cardiovascular status: blood pressure returned to baseline and stable Postop Assessment: adequate PO intake Anesthetic complications: no   No notable events documented.   Last Vitals:  Vitals:   08/12/22 0931 08/12/22 0941  BP: 105/78 124/84  Pulse: 88 91  Resp: (!) 24 16  Temp:    SpO2: 94% 96%    Last Pain:  Vitals:   08/12/22 0825  TempSrc: Temporal  PainSc: 0-No pain                 Reed Breech

## 2022-08-13 ENCOUNTER — Encounter: Payer: Self-pay | Admitting: Gastroenterology

## 2022-08-16 ENCOUNTER — Encounter: Payer: Self-pay | Admitting: Gastroenterology

## 2022-08-26 ENCOUNTER — Telehealth: Payer: Self-pay

## 2022-08-26 NOTE — Telephone Encounter (Signed)
[facility] is requesting patient colonoscopy and path report fax to them. Printed and faxed to them at [phone]

## 2023-04-21 LAB — HEMOGLOBIN A1C: Hemoglobin A1C: 6.5

## 2024-01-22 ENCOUNTER — Ambulatory Visit: Admitting: Family Medicine

## 2024-01-22 ENCOUNTER — Encounter: Payer: Self-pay | Admitting: Family Medicine

## 2024-01-22 ENCOUNTER — Other Ambulatory Visit: Payer: Self-pay | Admitting: Family Medicine

## 2024-01-22 VITALS — BP 138/80 | HR 98 | Ht 70.0 in | Wt 190.1 lb

## 2024-01-22 DIAGNOSIS — D849 Immunodeficiency, unspecified: Secondary | ICD-10-CM

## 2024-01-22 DIAGNOSIS — Z944 Liver transplant status: Secondary | ICD-10-CM | POA: Diagnosis not present

## 2024-01-22 DIAGNOSIS — Z0000 Encounter for general adult medical examination without abnormal findings: Secondary | ICD-10-CM

## 2024-01-22 DIAGNOSIS — Z7689 Persons encountering health services in other specified circumstances: Secondary | ICD-10-CM | POA: Diagnosis not present

## 2024-01-22 DIAGNOSIS — Z8249 Family history of ischemic heart disease and other diseases of the circulatory system: Secondary | ICD-10-CM

## 2024-01-22 DIAGNOSIS — Z7985 Long-term (current) use of injectable non-insulin antidiabetic drugs: Secondary | ICD-10-CM | POA: Diagnosis not present

## 2024-01-22 DIAGNOSIS — E1169 Type 2 diabetes mellitus with other specified complication: Secondary | ICD-10-CM | POA: Diagnosis not present

## 2024-01-22 DIAGNOSIS — Z125 Encounter for screening for malignant neoplasm of prostate: Secondary | ICD-10-CM

## 2024-01-22 DIAGNOSIS — E119 Type 2 diabetes mellitus without complications: Secondary | ICD-10-CM

## 2024-01-22 MED ORDER — JARDIANCE 25 MG PO TABS: 25.0000 mg | ORAL_TABLET | Freq: Every day | ORAL | 1 refills | Status: AC

## 2024-01-22 NOTE — Progress Notes (Addendum)
 "  Subjective:    Patient ID: Matthew Benson, male    DOB: 11-22-1960, 63 y.o.   MRN: [id number]  Matthew Benson is a 63 y.o. male presenting on 01/22/2024 for Establish Care   HPI  Discussed the use of AI scribe software for clinical note transcription with the patient, who gave verbal consent to proceed.  History of Present Illness   Matthew Benson is a 63 year old male with type 2 diabetes who presents for management of blood sugar levels.  PCP - Previously Dr Honor Cork who has now retired Specialist - Dr Tobias Cleaves - [facility] / Transplant   Gastrointestinal symptoms - Intermittent upper intestinal pain attributed to surgical alteration during bile duct correction - Suspects Ozempic may exacerbate abdominal pain  Foot lesions and nail bed changes - Sores and nail bed problems attributed to liver disease and circulatory issues prior to transplant - No numbness or tingling in feet  Elevated BP without Hypertension Initial reading elevated No recent home readings. But historically he has had controlled BP and low at times. - Past history Episode of extremely low blood pressure during hospitalization for liver issues      S/p Liver Transplant 2012, immunosuppressive drug therapy, dx with cryptogenic cirrhosis  Post-liver transplant complications - Liver transplant performed in 2011 - History of bile duct stricture requiring surgical correction - History of cytomegalovirus (CMV) infection post-transplant  CHRONIC DM, Type 2: - Difficulty controlling blood glucose since liver transplant in 2011 - Fasting blood glucose levels range from 160-170 mg/dL - Hemoglobin J8r last recorded at 6.5% in March 2025; previously achieved levels as low as 5.7% in past 2-3 years - Current regimen includes metformin, Ozempic, and Jardiance  - Ozempic dose reduced due to hypoglycemia concerns, resulting in increased blood glucose - Jardiance  added for glycemic control, but  not taken for past two months due to running out of medication - No known diabetic complications including retinopathy, nephropathy, or neuropathy Meds: see above Denies hypoglycemia, polyuria, visual changes, numbness or tingling.       01/22/2024    9:31 AM  Depression screen PHQ 2/9  Decreased Interest 0  Down, Depressed, Hopeless 0  PHQ - 2 Score 0       01/22/2024    9:31 AM  GAD 7 : Generalized Anxiety Score  Nervous, Anxious, on Edge 0  Control/stop worrying 0  Worry too much - different things 0  Trouble relaxing 0  Restless 0  Easily annoyed or irritable 0  Afraid - awful might happen 0  Total GAD 7 Score 0  Anxiety Difficulty Not difficult at all     Past Medical History:  Diagnosis Date   Biliary stricture of transplanted liver (HCC)    Cellulitis of left leg    Diabetes mellitus without complication (HCC)    Klebsiella pneumoniae sepsis (HCC)    Obesity    Past Surgical History:  Procedure Laterality Date   COLONOSCOPY WITH PROPOFOL  N/A 10/09/2016   Procedure: COLONOSCOPY WITH PROPOFOL ;  Surgeon: Gaylyn Gladis PENNER, MD;  Location: [facility] ENDOSCOPY;  Service: Endoscopy;  Laterality: N/A;   COLONOSCOPY WITH PROPOFOL  N/A 08/12/2022   Procedure: COLONOSCOPY WITH PROPOFOL ;  Surgeon: Jinny Carmine, MD;  Location: [facility] ENDOSCOPY;  Service: Endoscopy;  Laterality: N/A;   ERCP W/ METAL STENT PLACEMENT  [id number]   EXPLORATORY LAPAROTOMY     HERNIA REPAIR     POLYPECTOMY  08/12/2022   Procedure: POLYPECTOMY;  Surgeon: Jinny Carmine,  MD;  Location: [facility] ENDOSCOPY;  Service: Endoscopy;;   ROUX-EN-Y PROCEDURE  03/12/2011   TONSILLECTOMY     Social History   Socioeconomic History   Marital status: Married    Spouse name: Not on file   Number of children: Not on file   Years of education: Not on file   Highest education level: Associate degree: occupational, scientist, product/process development, or vocational program  Occupational History   Not on file  Tobacco Use   Smoking status:  Never   Smokeless tobacco: Never  Vaping Use   Vaping status: Never Used  Substance and Sexual Activity   Alcohol use: No   Drug use: No   Sexual activity: Yes  Other Topics Concern   Not on file  Social History Narrative   Not on file   Social Drivers of Health   Tobacco Use: Low Risk (01/22/2024)   Patient History    Smoking Tobacco Use: Never    Smokeless Tobacco Use: Never    Passive Exposure: Not on file  Financial Resource Strain: Low Risk (01/21/2024)   Overall Financial Resource Strain (CARDIA)    Difficulty of Paying Living Expenses: Not very hard  Food Insecurity: No Food Insecurity (01/21/2024)   Epic    Worried About Radiation Protection Practitioner of Food in the Last Year: Never true    Ran Out of Food in the Last Year: Never true  Transportation Needs: No Transportation Needs (01/21/2024)   Epic    Lack of Transportation (Medical): No    Lack of Transportation (Non-Medical): No  Physical Activity: Insufficiently Active (01/21/2024)   Exercise Vital Sign    Days of Exercise per Week: 2 days    Minutes of Exercise per Session: 10 min  Stress: No Stress Concern Present (01/21/2024)   [employer] of Occupational Health - Occupational Stress Questionnaire    Feeling of Stress: Only a little  Social Connections: Socially Integrated (01/21/2024)   Social Connection and Isolation Panel    Frequency of Communication with Friends and Family: Three times a week    Frequency of Social Gatherings with Friends and Family: Once a week    Attends Religious Services: More than 4 times per year    Active Member of Clubs or Organizations: Yes    Attends Engineer, Structural: More than 4 times per year    Marital Status: Married  Catering Manager Violence: Not on file  Depression (PHQ2-9): Low Risk (01/22/2024)   Depression (PHQ2-9)    PHQ-2 Score: 0  Alcohol Screen: Low Risk (01/21/2024)   Alcohol Screen    Last Alcohol Screening Score (AUDIT): 1  Housing: Low Risk  (01/21/2024)   Epic    Unable to Pay for Housing in the Last Year: No    Number of Times Moved in the Last Year: 0    Homeless in the Last Year: No  Utilities: Not on file  Health Literacy: Not on file   Family History  Problem Relation Age of Onset   Heart disease Mother    Current Outpatient Medications on File Prior to Visit  Medication Sig   aspirin 81 MG chewable tablet Chew by mouth.   escitalopram (LEXAPRO) 10 MG tablet Take 10 mg by mouth daily.   metFORMIN (GLUCOPHAGE) 500 MG tablet Take by mouth 2 (two) times daily with a meal.   omeprazole (PRILOSEC) 20 MG capsule Take 20 mg by mouth daily.   OZEMPIC, 1 MG/DOSE, 2 MG/1.5ML SOPN INJECT 1 MG UNDER THE SKIN ONCE A  WEEK   tacrolimus (PROGRAF) 1 MG capsule Take 1 mg by mouth 2 (two) times daily. Take 2 mg in am and 1 mg in pm   CALCIUM CARBONATE PO Take 500 mg by mouth daily. (Patient not taking: Reported on 01/22/2024)   [DISCONTINUED] glimepiride (AMARYL) 1 MG tablet Take 1 mg by mouth daily with breakfast.   [DISCONTINUED] liraglutide (VICTOZA) 18 MG/3ML SOPN Inject 18 mg into the skin daily.   No current facility-administered medications on file prior to visit.    Review of Systems Per HPI unless specifically indicated above     Objective:    BP 138/80 (BP Location: Left Arm, Cuff Size: Normal)   Pulse 98   Ht 5' 10 (1.778 m)   Wt 190 lb 2 oz (86.2 kg)   SpO2 96%   BMI 27.28 kg/m   Wt Readings from Last 3 Encounters:  01/22/24 190 lb 2 oz (86.2 kg)  08/12/22 188 lb (85.3 kg)  06/17/22 194 lb 2 oz (88.1 kg)    Physical Exam Vitals and nursing note reviewed.  Constitutional:      General: He is not in acute distress.    Appearance: Normal appearance. He is well-developed. He is not diaphoretic.     Comments: Well-appearing, comfortable, cooperative  HENT:     Head: Normocephalic and atraumatic.  Eyes:     General:        Right eye: No discharge.        Left eye: No discharge.     Conjunctiva/sclera:  Conjunctivae normal.  Cardiovascular:     Rate and Rhythm: Normal rate.  Pulmonary:     Effort: Pulmonary effort is normal.  Skin:    General: Skin is warm and dry.     Findings: No erythema or rash.  Neurological:     Mental Status: He is alert and oriented to person, place, and time.  Psychiatric:        Mood and Affect: Mood normal.        Behavior: Behavior normal.        Thought Content: Thought content normal.     Comments: Well groomed, good eye contact, normal speech and thoughts     Diabetic Foot Exam - Simple   Simple Foot Form Diabetic Foot exam was performed with the following findings: Yes 01/22/2024  9:26 AM  Visual Inspection See comments: Yes Sensation Testing Intact to touch and monofilament testing bilaterally: Yes Pulse Check Posterior Tibialis and Dorsalis pulse intact bilaterally: Yes Comments Toenails thicker and abnormal appearance, and has some dry skin, mild callus formation but no ulceration. Intact monofilament.      Results for orders placed or performed in visit on 01/22/24  Hemoglobin A1c   Collection Time: 04/21/23 12:00 AM  Result Value Ref Range   Hemoglobin A1C 6.5       Assessment & Plan:   Problem List Items Addressed This Visit     Status post liver transplant (HCC) (Chronic)   Type 2 diabetes mellitus (HCC) - Primary (Chronic)   Relevant Medications   JARDIANCE  25 MG TABS tablet   Other Relevant Orders   CT CARDIAC SCORING (SELF PAY ONLY)   Immunosuppression   Other Visit Diagnoses       Encounter to establish care with new doctor         Family history of heart disease       Relevant Orders   CT CARDIAC SCORING (SELF PAY ONLY)     Long-term  current use of injectable noninsulin antidiabetic medication           Establish care from previous PCP Dr Corlis Scan paper records into chart. Updated Health Maintenance information Encouraged improvement to lifestyle with diet and exercise Goal of weight loss  Type 2  diabetes mellitus with specified complication - Hyperlipidemia and Hyperglycemia Fluctuating blood glucose levels with fasting levels 160-170 mg/dL. A1c 6.5% in March 2025. Discussed switching Ozempic to Mounjaro, reassessment in three months. - Continue Ozempic 1mg  weekly for now, no new order needed, consider dose inc vs Mounjaro in future - Continue Metformin 500mg  TWICE A DAY  - Refilled Jardiance  25 mg for 90 days. - Follow-up in three months to reassess glycemic control and medication. - Ordered fasting blood glucose test today. - Scheduled fasting blood draw and follow-up in three months.  Status post liver transplant with immunosuppression Cryptogenic Cirrhosis Followed by Dr Tobias Cleaves - [facility] Post liver transplant 2011, no current complications. Discussed immunosuppressive therapy impact on glucose control. - Continue current immunosuppressive regimen.  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 No treatment due to liver impact of antifungals. - Offered referral to podiatry for evaluation and management. Defer referral until patient is ready  General health maintenance LDL cholesterol in 50s, family history of heart disease. Discussed heart scan for screening. - Ordered CT calcium heart scan for screening. - Scheduled fasting blood draw and follow-up in three months for comprehensive assessment.        Orders Placed This Encounter  Procedures   CT CARDIAC SCORING (SELF PAY ONLY)    Standing Status:   Future    Expiration Date:   01/21/2025    Preferred imaging location?:   [facility]   Hemoglobin A1c    This external order was created through the Results Console.    Meds ordered this encounter  Medications   JARDIANCE  25 MG TABS tablet    Sig: Take 1 tablet (25 mg total) by mouth daily before breakfast.    Dispense:  90 tablet    Refill:  1     Follow up plan: Return  for 3 month fasting lab > 1 week later Annual Physical.  04/12/24  Marsa Officer, DO [facility] 01/22/2024, 9:04 AM  "

## 2024-01-22 NOTE — Patient Instructions (Addendum)
 Thank you for coming to the office today.  Refilled Jardiance    You have been referred for a Coronary Calcium Score Cardiac CT Scan. This is a screening test for patients aged 61-50+ with cardiovascular risk factors or who are healthy but would be interested in Cardiovascular Screening for heart disease. Even if there is a family history of heart disease, this imaging can be useful. Typically it can be done every 5+ years or at a different timeline we agree on  The scan will look at the chest and mainly focus on the heart and identify early signs of calcium build up or blockages within the heart arteries. It is not 100% accurate for identifying blockages or heart disease, but it is useful to help us  predict who may have some early changes or be at risk in the future for a heart attack or cardiovascular problem.  The results are reviewed by a Cardiologist and they will document the results. It should become available on MyChart. Typically the results are divided into percentiles based on other patients of the same demographic and age. So it will compare your risk to others similar to you. If you have a higher score >99 or higher percentile >75%tile, it is recommended to consider Statin cholesterol therapy and or referral to Cardiologist. I will try to help explain your results and if we have questions we can contact the Cardiologist.  You will be contacted for scheduling. Usually it is done at any imaging facility through [facility], [facility] or [facility].  The cost is $99 flat fee total and it does not go through insurance, so no authorization is required.   DUE for FASTING BLOOD WORK (no food or drink after midnight before the lab appointment, only water or coffee without cream/sugar on the morning of)  SCHEDULE Lab Only visit in the morning at the clinic for lab draw in 3 MONTHS   - Make sure Lab Only appointment is at about 1 week before your next  appointment, so that results will be available  For Lab Results, once available within 2-3 days of blood draw, you can can log in to MyChart online to view your results and a brief explanation. Also, we can discuss results at next follow-up visit.   Please schedule a Follow-up Appointment to: Return for 3 month fasting lab > 1 week later Annual Physical.  If you have any other questions or concerns, please feel free to call the office or send a message through MyChart. You may also schedule an earlier appointment if necessary.  Additionally, you may be receiving a survey about your experience at our office within a few days to 1 week by e-mail or mail. We value your feedback.  Marsa Officer, DO [facility], [place]

## 2024-01-26 LAB — OPHTHALMOLOGY REPORT-SCANNED

## 2024-02-01 ENCOUNTER — Ambulatory Visit
Admission: RE | Admit: 2024-02-01 | Discharge: 2024-02-01 | Disposition: A | Discharge status: Home / Self Care | Payer: Self-pay | Source: Ambulatory Visit | Attending: Family Medicine | Admitting: Family Medicine

## 2024-02-01 DIAGNOSIS — Z8249 Family history of ischemic heart disease and other diseases of the circulatory system: Secondary | ICD-10-CM | POA: Insufficient documentation

## 2024-02-01 DIAGNOSIS — E1169 Type 2 diabetes mellitus with other specified complication: Secondary | ICD-10-CM | POA: Insufficient documentation

## 2024-02-03 ENCOUNTER — Ambulatory Visit: Payer: Self-pay | Admitting: Family Medicine

## 2024-02-03 DIAGNOSIS — E1169 Type 2 diabetes mellitus with other specified complication: Secondary | ICD-10-CM

## 2024-02-04 NOTE — Telephone Encounter (Signed)
 Copied from CRM #[id number]. Topic: Clinical - Medication Question >> Feb 04, 2024  9:30 AM Tiffini S wrote: Reason for CRM: Patient states that he had a missed call- explained the results for the  Heart CT Scan Patient is asking for a call back to discuss which Statin medication will be prescribed. Please call the patient at [phone]

## 2024-02-12 ENCOUNTER — Other Ambulatory Visit

## 2024-02-12 DIAGNOSIS — Z125 Encounter for screening for malignant neoplasm of prostate: Secondary | ICD-10-CM

## 2024-02-12 DIAGNOSIS — E119 Type 2 diabetes mellitus without complications: Secondary | ICD-10-CM

## 2024-02-12 DIAGNOSIS — Z0000 Encounter for general adult medical examination without abnormal findings: Secondary | ICD-10-CM

## 2024-02-12 DIAGNOSIS — D849 Immunodeficiency, unspecified: Secondary | ICD-10-CM

## 2024-02-12 DIAGNOSIS — E1169 Type 2 diabetes mellitus with other specified complication: Secondary | ICD-10-CM

## 2024-02-12 DIAGNOSIS — Z944 Liver transplant status: Secondary | ICD-10-CM

## 2024-02-12 NOTE — Addendum Note (Signed)
 Addended by: EDMAN MARSA PARAS on: 02/12/2024 06:16 PM   Modules accepted: Orders

## 2024-02-12 NOTE — Telephone Encounter (Signed)
 I will remind you on Monday to confirm with the lab - but for his lab draw on Mon 1/19, he should only be getting a Lipid Panel.  There were other future orders on his chart for his physical in March. I see that all of his future physical labs were released and are active. We will need to make sure that none of those labs are drawn.  I have re ordered a single Lipid panel only that should be drawn on Monday 02/15/24.  Thank you!  Marsa Officer, DO [facility] 02/12/2024, 6:16 PM

## 2024-02-15 ENCOUNTER — Other Ambulatory Visit

## 2024-02-16 LAB — LIPID PANEL
Cholesterol: 125 mg/dL
HDL: 38 mg/dL — ABNORMAL LOW
LDL Cholesterol (Calc): 60 mg/dL
Non-HDL Cholesterol (Calc): 87 mg/dL
Total CHOL/HDL Ratio: 3.3 (calc)
Triglycerides: 204 mg/dL — ABNORMAL HIGH

## 2024-02-16 MED ORDER — ROSUVASTATIN CALCIUM 10 MG PO TABS: 10.0000 mg | ORAL_TABLET | Freq: Every day | ORAL | 3 refills | Status: AC

## 2024-02-16 NOTE — Addendum Note (Signed)
 Addended by: EDMAN MARSA PARAS on: 02/16/2024 07:40 PM   Modules accepted: Orders

## 2024-04-12 ENCOUNTER — Other Ambulatory Visit

## 2024-04-22 ENCOUNTER — Encounter: Admitting: Family Medicine
